# Patient Record
Sex: Female | Born: 1985 | Race: White | Hispanic: No | Marital: Married | Smoking: Never smoker
Health system: Southern US, Community
[De-identification: ages and names within clinical notes are randomized; demographics above are authoritative.]

## PROBLEM LIST (undated history)

## (undated) DIAGNOSIS — I341 Nonrheumatic mitral (valve) prolapse: Secondary | ICD-10-CM

## (undated) DIAGNOSIS — K589 Irritable bowel syndrome without diarrhea: Secondary | ICD-10-CM

## (undated) HISTORY — PX: TONSILLECTOMY: SUR1361

---

## 2015-12-03 ENCOUNTER — Emergency Department (HOSPITAL_COMMUNITY)
Admission: EM | Admit: 2015-12-03 | Discharge: 2015-12-03 | Disposition: A | Discharge status: Home / Self Care | Payer: Self-pay | Attending: Emergency Medicine | Admitting: Emergency Medicine

## 2015-12-03 ENCOUNTER — Emergency Department (HOSPITAL_COMMUNITY): Payer: Self-pay

## 2015-12-03 ENCOUNTER — Encounter (HOSPITAL_COMMUNITY): Payer: Self-pay | Admitting: Emergency Medicine

## 2015-12-03 DIAGNOSIS — R42 Dizziness and giddiness: Secondary | ICD-10-CM | POA: Insufficient documentation

## 2015-12-03 DIAGNOSIS — R079 Chest pain, unspecified: Secondary | ICD-10-CM | POA: Insufficient documentation

## 2015-12-03 DIAGNOSIS — R5383 Other fatigue: Secondary | ICD-10-CM | POA: Insufficient documentation

## 2015-12-03 DIAGNOSIS — R55 Syncope and collapse: Secondary | ICD-10-CM | POA: Insufficient documentation

## 2015-12-03 DIAGNOSIS — Z8679 Personal history of other diseases of the circulatory system: Secondary | ICD-10-CM | POA: Insufficient documentation

## 2015-12-03 DIAGNOSIS — R0602 Shortness of breath: Secondary | ICD-10-CM | POA: Insufficient documentation

## 2015-12-03 DIAGNOSIS — Z8719 Personal history of other diseases of the digestive system: Secondary | ICD-10-CM | POA: Insufficient documentation

## 2015-12-03 DIAGNOSIS — Z3202 Encounter for pregnancy test, result negative: Secondary | ICD-10-CM | POA: Insufficient documentation

## 2015-12-03 HISTORY — DX: Nonrheumatic mitral (valve) prolapse: I34.1

## 2015-12-03 HISTORY — DX: Irritable bowel syndrome without diarrhea: K58.9

## 2015-12-03 LAB — CBC
HCT: 35.9 % — ABNORMAL LOW (ref 36.0–46.0)
HEMOGLOBIN: 11.3 g/dL — AB (ref 12.0–15.0)
MCH: 26.6 pg (ref 26.0–34.0)
MCHC: 31.5 g/dL (ref 30.0–36.0)
MCV: 84.5 fL (ref 78.0–100.0)
PLATELETS: 341 10*3/uL (ref 150–400)
RBC: 4.25 MIL/uL (ref 3.87–5.11)
RDW: 14.6 % (ref 11.5–15.5)
WBC: 10.7 10*3/uL — AB (ref 4.0–10.5)

## 2015-12-03 LAB — CBG MONITORING, ED: Glucose-Capillary: 85 mg/dL (ref 65–99)

## 2015-12-03 LAB — I-STAT BETA HCG BLOOD, ED (MC, WL, AP ONLY)

## 2015-12-03 LAB — URINALYSIS, ROUTINE W REFLEX MICROSCOPIC
BILIRUBIN URINE: NEGATIVE
Glucose, UA: NEGATIVE mg/dL
HGB URINE DIPSTICK: NEGATIVE
Ketones, ur: NEGATIVE mg/dL
Leukocytes, UA: NEGATIVE
Nitrite: NEGATIVE
Protein, ur: NEGATIVE mg/dL
SPECIFIC GRAVITY, URINE: 1.006 (ref 1.005–1.030)
pH: 5.5 (ref 5.0–8.0)

## 2015-12-03 LAB — BASIC METABOLIC PANEL
Anion gap: 11 (ref 5–15)
BUN: 10 mg/dL (ref 6–20)
CHLORIDE: 105 mmol/L (ref 101–111)
CO2: 24 mmol/L (ref 22–32)
CREATININE: 0.71 mg/dL (ref 0.44–1.00)
Calcium: 9.6 mg/dL (ref 8.9–10.3)
GFR calc Af Amer: >60 mL/min (ref >60)
GFR calc non Af Amer: >60 mL/min (ref >60)
GLUCOSE: 110 mg/dL — AB (ref 65–99)
Potassium: 3.1 mmol/L — ABNORMAL LOW (ref 3.5–5.1)
SODIUM: 140 mmol/L (ref 135–145)

## 2015-12-03 LAB — TSH: TSH: 1.668 u[IU]/mL (ref 0.350–4.500)

## 2015-12-03 LAB — D-DIMER, QUANTITATIVE (NOT AT ARMC)

## 2015-12-03 MED ORDER — SODIUM CHLORIDE 0.9 % IV BOLUS (SEPSIS)
1000.0000 mL | Freq: Once | INTRAVENOUS | Status: AC
  Administered 2015-12-03: 1000 mL via INTRAVENOUS

## 2015-12-03 MED ORDER — POTASSIUM CHLORIDE CRYS ER 20 MEQ PO TBCR
40.0000 meq | EXTENDED_RELEASE_TABLET | Freq: Once | ORAL | Status: AC
  Administered 2015-12-03: 40 meq via ORAL
  Filled 2015-12-03: qty 2

## 2015-12-03 MED ORDER — ACETAMINOPHEN 500 MG PO TABS
1000.0000 mg | ORAL_TABLET | Freq: Once | ORAL | Status: AC
  Administered 2015-12-03: 1000 mg via ORAL
  Filled 2015-12-03: qty 2

## 2015-12-03 NOTE — ED Provider Notes (Signed)
CSN: [id number]     Arrival date & time 12/03/15  1609 History   First MD Initiated Contact with Patient 12/03/15 1612     Chief Complaint  Patient presents with  . Loss of Consciousness     (Consider location/radiation/quality/duration/timing/severity/associated sxs/prior Treatment) HPI Comments: Patient with history of mitral valve prolapse, and IBS presents to the ED with a chief complaint of syncope.  Patient had a syncopal episode from sitting today.  She states that she had some brief chest pain and SOB of breath prior to passing out.  States that she also felt lightheaded and felt herself blacking out.  She has had the happen one time in the past.  She does not taken any medications.  She has been given 324 of aspirin.  CBG 85.  She denies any recent long travel or immobilization.  Denies history of PE, DVT, or ACS.  She does not taken any exogenous estrogen.  Additionally, she states that she has been losing her hair and feeling very fatigued.  The history is provided by the patient. No language interpreter was used.    Past Medical History  Diagnosis Date  . Mitral valve prolapse   . IBS (irritable bowel syndrome)    Past Surgical History  Procedure Laterality Date  . Tonsillectomy     No family history on file. Social History  Substance Use Topics  . Smoking status: Never Smoker   . Smokeless tobacco: None  . Alcohol Use: Yes     Comment: occ   OB History    No data available     Review of Systems  Constitutional: Positive for fatigue. Negative for fever and chills.  Respiratory: Positive for shortness of breath.   Cardiovascular: Positive for chest pain.  Gastrointestinal: Negative for nausea, vomiting, diarrhea and constipation.  Genitourinary: Negative for dysuria.  Neurological: Positive for syncope and light-headedness.  All other systems reviewed and are negative.     Allergies  Ibuprofen  Home Medications   Prior to Admission medications   Not  on File   BP 127/72 mmHg  Pulse 85  Resp 19  SpO2 100%  LMP 11/26/2015 Physical Exam  Constitutional: She is oriented to person, place, and time. She appears well-developed and well-nourished.  HENT:  Head: Normocephalic and atraumatic.  Eyes: Conjunctivae and EOM are normal. Pupils are equal, round, and reactive to light.  Neck: Normal range of motion. Neck supple.  Cardiovascular: Normal rate and regular rhythm.  Exam reveals no gallop and no friction rub.   No murmur heard. Pulmonary/Chest: Effort normal and breath sounds normal. No respiratory distress. She has no wheezes. She has no rales. She exhibits no tenderness.  Abdominal: Soft. Bowel sounds are normal. She exhibits no distension and no mass. There is no tenderness. There is no rebound and no guarding.  Musculoskeletal: Normal range of motion. She exhibits no edema or tenderness.  Neurological: She is alert and oriented to person, place, and time.  Skin: Skin is warm and dry.  Psychiatric: She has a normal mood and affect. Her behavior is normal. Judgment and thought content normal.  Nursing note and vitals reviewed.   ED Course  Procedures (including critical care time) Results for orders placed or performed during the hospital encounter of 12/03/15  Basic metabolic panel  Result Value Ref Range   Sodium 140 135 - 145 mmol/L   Potassium 3.1 (L) 3.5 - 5.1 mmol/L   Chloride 105 101 - 111 mmol/L   CO2 24  22 - 32 mmol/L   Glucose, Bld 110 (H) 65 - 99 mg/dL   BUN 10 6 - 20 mg/dL   Creatinine, Ser 4.09 0.44 - 1.00 mg/dL   Calcium 9.6 8.9 - 81.1 mg/dL   GFR calc non Af Amer >60 >60 mL/min   GFR calc Af Amer >60 >60 mL/min   Anion gap 11 5 - 15  CBC  Result Value Ref Range   WBC 10.7 (H) 4.0 - 10.5 K/uL   RBC 4.25 3.87 - 5.11 MIL/uL   Hemoglobin 11.3 (L) 12.0 - 15.0 g/dL   HCT 91.4 (L) 78.2 - 95.6 %   MCV 84.5 78.0 - 100.0 fL   MCH 26.6 26.0 - 34.0 pg   MCHC 31.5 30.0 - 36.0 g/dL   RDW 21.3 08.6 - 57.8 %    Platelets 341 150 - 400 K/uL  Urinalysis, Routine w reflex microscopic (not at [facility])  Result Value Ref Range   Color, Urine YELLOW YELLOW   APPearance CLEAR CLEAR   Specific Gravity, Urine 1.006 1.005 - 1.030   pH 5.5 5.0 - 8.0   Glucose, UA NEGATIVE NEGATIVE mg/dL   Hgb urine dipstick NEGATIVE NEGATIVE   Bilirubin Urine NEGATIVE NEGATIVE   Ketones, ur NEGATIVE NEGATIVE mg/dL   Protein, ur NEGATIVE NEGATIVE mg/dL   Nitrite NEGATIVE NEGATIVE   Leukocytes, UA NEGATIVE NEGATIVE  D-dimer, quantitative (not at [facility])  Result Value Ref Range   D-Dimer, Quant <0.27 0.00 - 0.50 ug/mL-FEU  TSH  Result Value Ref Range   TSH 1.668 0.350 - 4.500 uIU/mL  CBG monitoring, ED  Result Value Ref Range   Glucose-Capillary 85 65 - 99 mg/dL  I-Stat beta hCG blood, ED ([facility], [facility], [facility] only)  Result Value Ref Range   I-stat hCG, quantitative <5.0 <5 mIU/mL   Comment 3           Dg Chest 2 View  12/03/2015  CLINICAL DATA:  Central chest pain.  Loss of consciousness. EXAM: CHEST  2 VIEW COMPARISON:  None. FINDINGS: The cardiomediastinal contours are normal. The lungs are clear. Pulmonary vasculature is normal. No consolidation, pleural effusion, or pneumothorax. Small sclerotic densities about both shoulders are likely bone islands or osteopoikilosis, benign. No acute osseous abnormalities are seen. IMPRESSION: No acute pulmonary process. Electronically Signed   By: Rubye Oaks M.D.   On: 12/03/2015 18:22    I have personally reviewed and evaluated these images and lab results as part of my medical decision-making.    MDM   Final diagnoses:  Syncope, unspecified syncope type    Patient with syncopal episode today.  Hx of MVP and murmur.  Brief CP prior to LOC.  Felt herself get light headed.  Patient discussed with Dr. Rubin Payor.  Symptoms seem consistent with orthostatic hypotension.  Will give 2 fluid boluses and reassess.  7:49 PM Patient reassessed.  Was able to complete orthostatic VS this  time.  States she only felt slightly dizzy.  Ambulates without difficulty, states only slightly lightheaded.  Plan for discharge to home.  Recommend close follow-up with PCP.    Low risk for serious outcome per [facility] Syncope Rule.  No hx of CHF, hematocrit >30%, no new EKG changes or arrhythmias, no SOB, systolic BP >90 in triage.     Roxy Horseman, PA-C 12/03/15 1954  Benjiman Core, MD 12/07/15 [phone]

## 2015-12-03 NOTE — ED Notes (Signed)
Walked patient to the bathroom pt,. Did real walking to but coming back felt alittle dizzy

## 2015-12-03 NOTE — Discharge Instructions (Signed)

## 2015-12-03 NOTE — ED Notes (Signed)
Pt transported to radiology.

## 2015-12-03 NOTE — ED Notes (Signed)
Per ems-- pt had syncopal episode today from sitting position. Hx of mitral valve prolapse. Pt c.o cp upon ems arrival and tingling in face and fingers. 12 lead unremarkable. Pt reports fatigue and some hair loss. No injury sustained during episode. 324 asa administered pta. bp 110/72

## 2016-08-18 IMAGING — CR DG CHEST 2V
2 series · 2 of 2 positions shown · non-contrast
Comparison: None.

CLINICAL DATA: Central chest pain.  Loss of consciousness.

EXAM:
CHEST  2 VIEW

[chest lat]
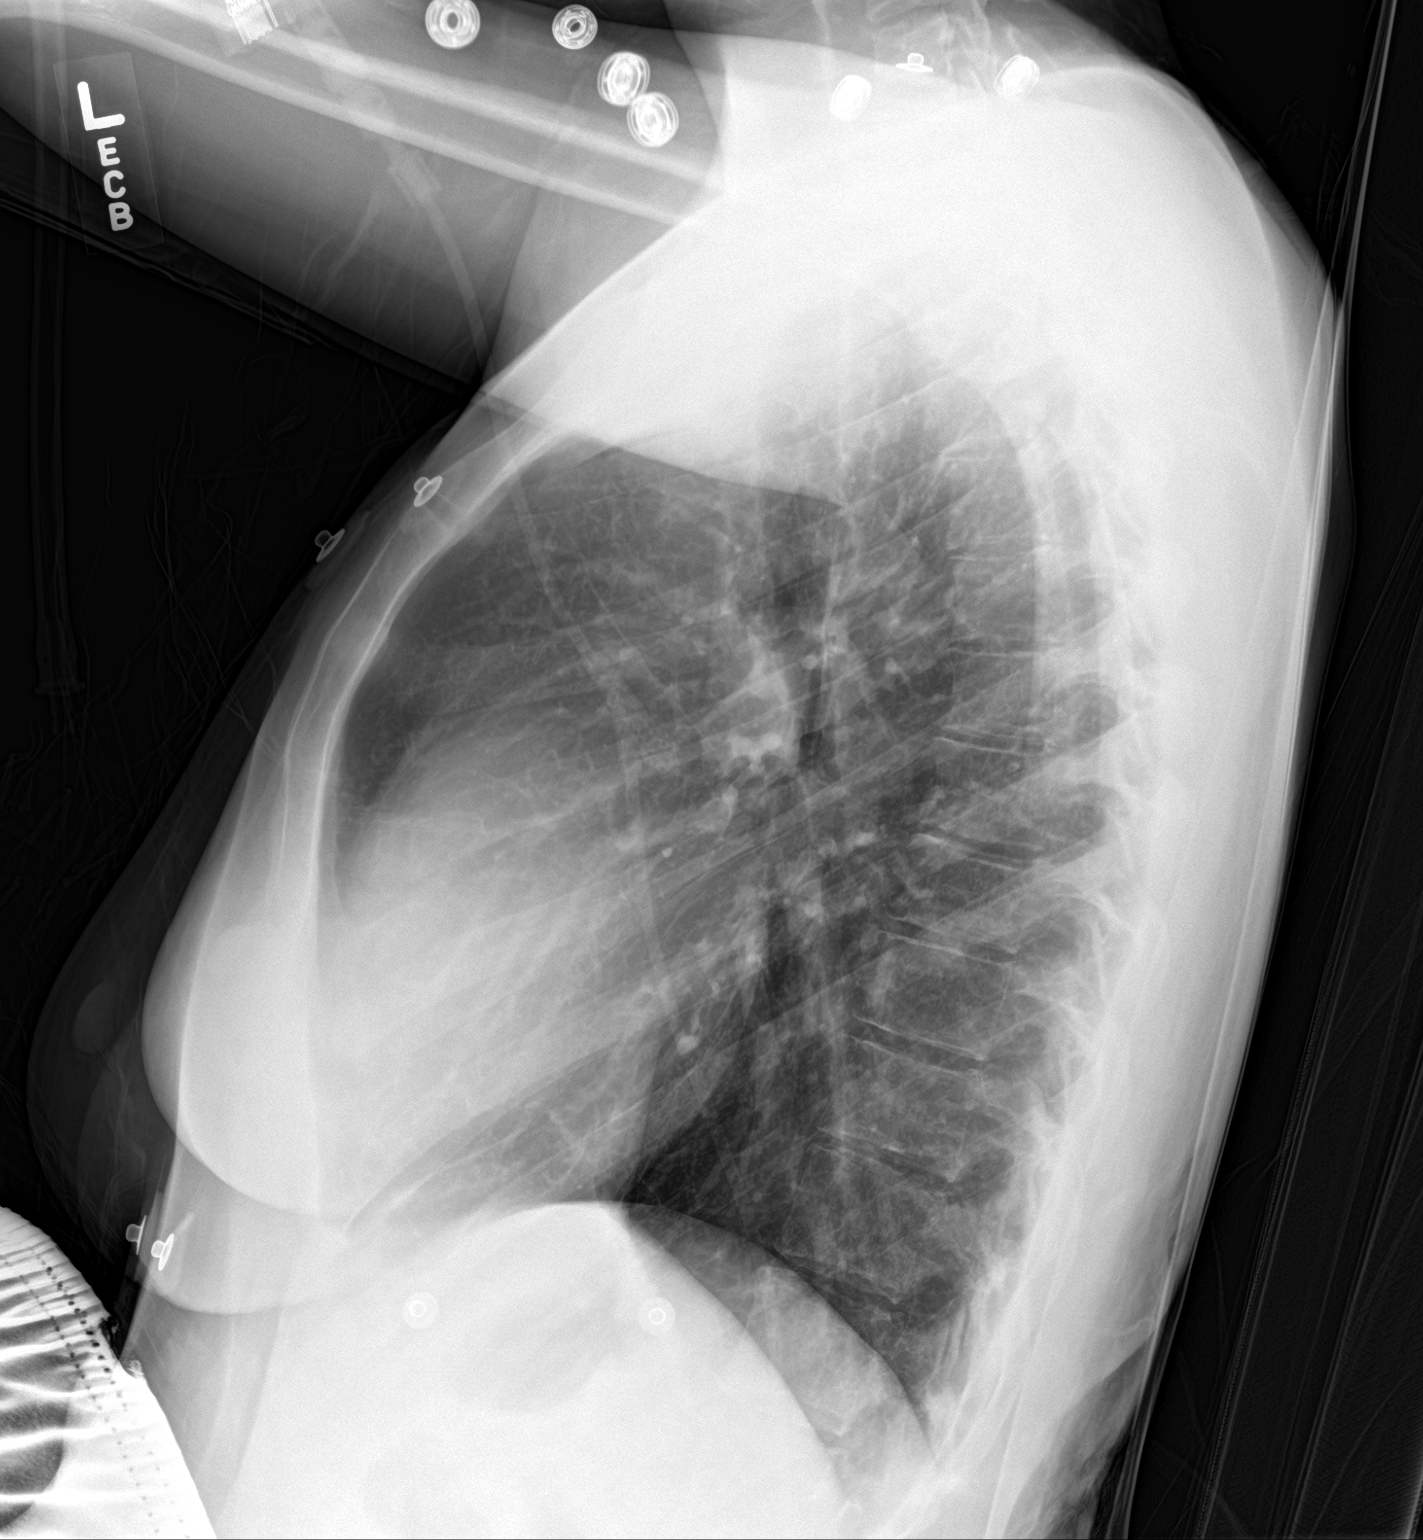

[chest ap]
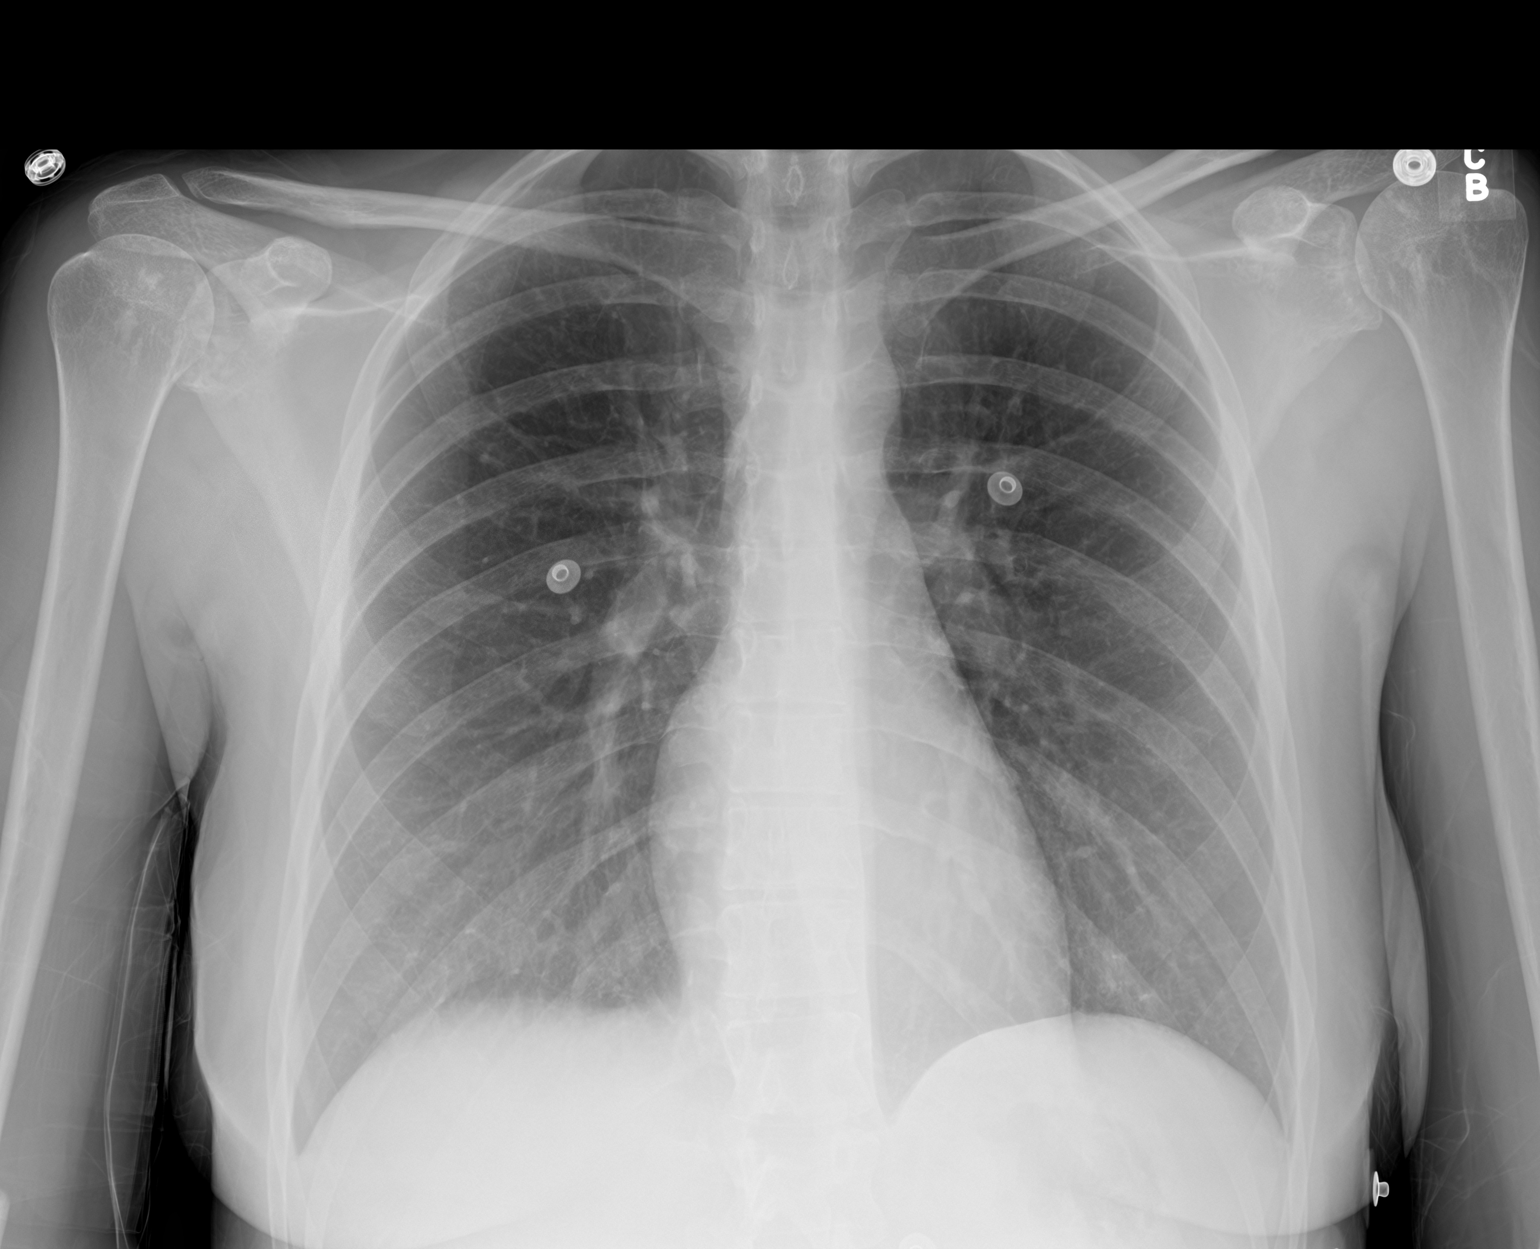

[2 of 2 positions shown; findings below may reference images not displayed]

FINDINGS: The cardiomediastinal contours are normal. The lungs are clear.
Pulmonary vasculature is normal. No consolidation, pleural effusion,
or pneumothorax. Small sclerotic densities about both shoulders are
likely bone islands or osteopoikilosis, benign. No acute osseous
abnormalities are seen.
IMPRESSION: No acute pulmonary process.
